# Patient Record
Sex: Male | Born: 1980 | Race: White | Hispanic: No | Marital: Married | Smoking: Never smoker
Health system: Southern US, Community
[De-identification: ages and names within clinical notes are randomized; demographics above are authoritative.]

## PROBLEM LIST (undated history)

## (undated) DIAGNOSIS — M109 Gout, unspecified: Secondary | ICD-10-CM

## (undated) HISTORY — PX: KNEE RECONSTRUCTION: SHX5883

---

## 2019-08-22 ENCOUNTER — Emergency Department
Admission: EM | Admit: 2019-08-22 | Discharge: 2019-08-22 | Disposition: A | Discharge status: Home / Self Care | Payer: Self-pay | Source: Home / Self Care

## 2019-08-22 ENCOUNTER — Other Ambulatory Visit: Payer: Self-pay

## 2019-08-22 DIAGNOSIS — M109 Gout, unspecified: Secondary | ICD-10-CM

## 2019-08-22 MED ORDER — COLCHICINE 0.6 MG PO TABS: ORAL_TABLET | ORAL | 0 refills | Status: AC

## 2019-08-22 MED ORDER — PREDNISONE 50 MG PO TABS: 50.0000 mg | ORAL_TABLET | Freq: Every day | ORAL | 0 refills | Status: AC

## 2019-08-22 NOTE — ED Triage Notes (Signed)
PT has a history of gout. Left foot pain and swelling started last night.

## 2019-08-22 NOTE — ED Provider Notes (Signed)
[facility]    CSN: [id number] Arrival date & time: 08/22/19  1104      History   Chief Complaint Chief Complaint  Patient presents with  . Foot Pain    HPI [facility] is a 38 y.o. male.   HPI [facility] is a 38 y.o. male presenting to UC with c/o sudden onset Left great toe pain and swelling, radiating up toward his ankle.  Pain started late last night and worsened this morning. Similar to prior gout flares. Last episode was 3-4 months ago. He has done well with colchicine.  Pain is currently 7/10.  He knows today's flare was triggered by consuming alcohol last night during a halloween party.    History reviewed. No pertinent past medical history.  There are no active problems to display for this patient.   Past Surgical History:  Procedure Laterality Date  . KNEE RECONSTRUCTION     both       Home Medications    Prior to Admission medications   Medication Sig Start Date End Date Taking? Authorizing Provider  colchicine 0.6 MG tablet Take 2 tabs (1.2mg ) followed one hour later by 1 tab (0.6mg ) 08/22/19   Lurene Shadow, PA-C  predniSONE (DELTASONE) 50 MG tablet Take 1 tablet (50 mg total) by mouth daily with breakfast for 5 days. 08/22/19 08/27/19  Lurene Shadow, PA-C    Family History History reviewed. No pertinent family history.  Social History Social History   Tobacco Use  . Smoking status: Never Smoker  . Smokeless tobacco: Current User  Substance Use Topics  . Alcohol use: Yes    Comment: 6/ week  . Drug use: Not Currently     Allergies   Patient has no known allergies.   Review of Systems Review of Systems  Constitutional: Negative for chills and fever.  Musculoskeletal: Positive for arthralgias and joint swelling.  Skin: Positive for color change. Negative for wound.     Physical Exam Triage Vital Signs ED Triage Vitals  Enc Vitals Group     BP 08/22/19 1123 (!) 158/90     Pulse Rate 08/22/19 1123 (!) 101    Resp 08/22/19 1123 20     Temp 08/22/19 1123 98.5 F (36.9 C)     Temp Source 08/22/19 1123 Oral     SpO2 08/22/19 1123 99 %     Weight 08/22/19 1120 295 lb (133.8 kg)     Height 08/22/19 1120 5\' 10"  (1.778 m)     Head Circumference --      Peak Flow --      Pain Score 08/22/19 1119 7     Pain Loc --      Pain Edu? --      Excl. in GC? --    No data found.  Updated Vital Signs BP (!) 158/90 (BP Location: Right Arm)   Pulse (!) 101   Temp 98.5 F (36.9 C) (Oral)   Resp 20   Ht 5\' 10"  (1.778 m)   Wt 295 lb (133.8 kg)   SpO2 99%   BMI 42.33 kg/m   Visual Acuity Right Eye Distance:   Left Eye Distance:   Bilateral Distance:    Right Eye Near:   Left Eye Near:    Bilateral Near:     Physical Exam Vitals signs and nursing note reviewed.  Constitutional:      Appearance: He is well-developed.  HENT:     Head: Normocephalic and atraumatic.  Neck:     Musculoskeletal: Normal range of motion.  Cardiovascular:     Rate and Rhythm: Normal rate.  Pulmonary:     Effort: Pulmonary effort is normal.  Musculoskeletal: Normal range of motion.        General: Swelling and tenderness present.     Comments: Left foot: mild to moderate edema at 1st metatarsal joint. Mild edema of foot. Tenderness to light touch over 1st metatarsal joint and dorsum of foot.  Full ROM all toes and ankle.   Skin:    General: Skin is warm and dry.     Capillary Refill: Capillary refill takes less than 2 seconds.     Findings: Erythema present.     Comments: Left foot: erythema of great toe.  Neurological:     Mental Status: He is alert and oriented to person, place, and time.     Sensory: No sensory deficit.  Psychiatric:        Behavior: Behavior normal.      UC Treatments / Results  Labs (all labs ordered are listed, but only abnormal results are displayed) Labs Reviewed - No data to display  EKG   Radiology No results found.  Procedures Procedures (including critical care  time)  Medications Ordered in UC Medications - No data to display  Initial Impression / Assessment and Plan / UC Course  I have reviewed the triage vital signs and the nursing notes.  Pertinent labs & imaging results that were available during my care of the patient were reviewed by me and considered in my medical decision making (see chart for details).    Hx and exam c/w gout F/u with PCP or Sports Medicine  AVS provided  Final Clinical Impressions(s) / UC Diagnoses   Final diagnoses:  Gouty arthritis of left great toe     Discharge Instructions      Please take your medication as prescribed. Follow up with family medicine or sports medicine as needed.  The prednisone you have been prescribed for 5 days can cause your blood pressure to be elevated. Please be sure to monitor your blood pressure. If you blood pressure continues to elevate, you may stop the prednisone at any time.     ED Prescriptions    Medication Sig Dispense Auth. Provider   colchicine 0.6 MG tablet Take 2 tabs (1.2mg ) followed one hour later by 1 tab (0.6mg ) 3 tablet Gerarda Fraction, Adaijah Endres O, PA-C   predniSONE (DELTASONE) 50 MG tablet Take 1 tablet (50 mg total) by mouth daily with breakfast for 5 days. 5 tablet Noe Gens, [place]     I have reviewed the PDMP during this encounter.   Noe Gens, PA-C 08/22/19 1143

## 2019-08-22 NOTE — Discharge Instructions (Signed)
°  Please take your medication as prescribed. Follow up with family medicine or sports medicine as needed.  The prednisone you have been prescribed for 5 days can cause your blood pressure to be elevated. Please be sure to monitor your blood pressure. If you blood pressure continues to elevate, you may stop the prednisone at any time.

## 2020-10-25 ENCOUNTER — Emergency Department: Admit: 2020-10-25 | Discharge status: Absent without leave | Payer: Self-pay

## 2020-10-25 ENCOUNTER — Emergency Department (INDEPENDENT_AMBULATORY_CARE_PROVIDER_SITE_OTHER)
Admission: EM | Admit: 2020-10-25 | Discharge: 2020-10-25 | Disposition: A | Discharge status: Home / Self Care | Payer: Self-pay | Source: Home / Self Care | Attending: Family Medicine | Admitting: Family Medicine

## 2020-10-25 ENCOUNTER — Other Ambulatory Visit: Payer: Self-pay

## 2020-10-25 DIAGNOSIS — R509 Fever, unspecified: Secondary | ICD-10-CM

## 2020-10-25 DIAGNOSIS — R1032 Left lower quadrant pain: Secondary | ICD-10-CM

## 2020-10-25 HISTORY — DX: Gout, unspecified: M10.9

## 2020-10-25 LAB — POCT URINALYSIS DIP (MANUAL ENTRY)
Glucose, UA: NEGATIVE mg/dL
Leukocytes, UA: NEGATIVE
Nitrite, UA: NEGATIVE
Protein Ur, POC: 100 mg/dL — AB
Spec Grav, UA: 1.02 (ref 1.010–1.025)
Urobilinogen, UA: 4 E.U./dL — AB
pH, UA: 6 (ref 5.0–8.0)

## 2020-10-25 LAB — POCT CBC W AUTO DIFF (K'VILLE URGENT CARE)

## 2020-10-25 NOTE — ED Notes (Signed)
Spoke w/ [facility] to request STAT pickup. Confirmation number [id number] given.

## 2020-10-25 NOTE — ED Triage Notes (Signed)
Pt presents to Urgent Care with c/o LLQ pain since yesterday. Reports noticing dark "orange" urine this AM.

## 2020-10-25 NOTE — ED Provider Notes (Signed)
[name]    CSN: [id number] Arrival date & time: 10/25/20  1926      History   Chief Complaint Chief Complaint  Patient presents with  . Abdominal Pain    LLQ    HPI Coleston Dirosa is a 40 y.o. male.   He is presenting with left lower quadrant abdominal pain.  He also has an elevated temperature.  His symptoms started yesterday and have progressed through today.  Denies any changes in his bowel movements.  He has had orange urine.  Denies any back pain.  No history of abdominal surgeries.  Denies any emesis.  No exposure anyone to flu or Covid.  HPI  Past Medical History:  Diagnosis Date  . Gout     There are no problems to display for this patient.   Past Surgical History:  Procedure Laterality Date  . KNEE RECONSTRUCTION     both       Home Medications    Prior to Admission medications   Medication Sig Start Date End Date Taking? Authorizing Provider  ibuprofen (ADVIL) 200 MG tablet Take 200 mg by mouth every 6 (six) hours as needed.   Yes [provider]  colchicine 0.6 MG tablet Take 2 tabs (1.2mg ) followed one hour later by 1 tab (0.6mg ) Patient taking differently: Take by mouth daily as needed. Take 2 tabs (1.2mg ) followed one hour later by 1 tab (0.6mg ) 08/22/19   Lurene Shadow, PA-C    Family History Family History  Problem Relation Age of Onset  . Heart Problems Mother     Social History Social History   Tobacco Use  . Smoking status: Never Smoker  . Smokeless tobacco: Current User    Types: Snuff  Vaping Use  . Vaping Use: Never used  Substance Use Topics  . Alcohol use: Yes    Alcohol/week: 6.0 standard drinks    Types: 6 Cans of beer per week    Comment: daily, 4-5 days per week  . Drug use: Not Currently     Allergies   Patient has no known allergies.   Review of Systems Review of Systems  See HPI   Physical Exam Triage Vital Signs ED Triage Vitals  Enc Vitals Group     BP 10/25/20 1959 138/85      Pulse Rate 10/25/20 1959 (!) 115     Resp 10/25/20 1959 18     Temp 10/25/20 1959 (!) 100.7 F (38.2 C)     Temp Source 10/25/20 1959 Oral     SpO2 10/25/20 1959 96 %     Weight 10/25/20 1958 295 lb (133.8 kg)     Height 10/25/20 1958 5\' 10"  (1.778 m)     Head Circumference --      Peak Flow --      Pain Score 10/25/20 1958 4     Pain Loc --      Pain Edu? --      Excl. in GC? --    No data found.  Updated Vital Signs BP 138/85 (BP Location: Right Arm)   Pulse (!) 115   Temp (!) 100.7 F (38.2 C) (Oral)   Resp 18   Ht 5\' 10"  (1.778 m)   Wt 133.8 kg   SpO2 96%   BMI 42.33 kg/m   Visual Acuity Right Eye Distance:   Left Eye Distance:   Bilateral Distance:    Right Eye Near:   Left Eye Near:    Bilateral  Near:     Physical Exam Gen: NAD, alert, cooperative with exam, well-appearing ENT: normal lips, normal nasal mucosa, tympanic membranes clear and intact bilaterally, normal oropharynx, no cervical lymphadenopathy Eye: normal EOM, normal conjunctiva and lids CV: Tachycardic S1-S2   Resp: no accessory muscle use, non-labored, clear to auscultation bilaterally, no crackles or wheezes GI: no masses or tenderness, no hernia, soft, normal bowel sounds Skin: no rashes, no areas of induration  Neuro: normal tone, normal sensation to touch Psych:  normal insight, alert and oriented MSK: Normal gait, normal strength    UC Treatments / Results  Labs (all labs ordered are listed, but only abnormal results are displayed) Labs Reviewed  POCT URINALYSIS DIP (MANUAL ENTRY) - Abnormal; Notable for the following components:      Result Value   Color, UA orange (*)    Bilirubin, UA moderate (*)    Ketones, POC UA moderate (40) (*)    Blood, UA trace-lysed (*)    Protein Ur, POC =100 (*)    Urobilinogen, UA 4.0 (*)    All other components within normal limits  COVID-19, FLU A+B NAA  COMPLETE METABOLIC PANEL WITH GFR  SEDIMENTATION RATE  C-REACTIVE PROTEIN  POCT CBC W  AUTO DIFF ([facility])    EKG   Radiology No results found.  Procedures Procedures (including critical care time)  Medications Ordered in UC Medications - No data to display  Initial Impression / Assessment and Plan / UC Course  I have reviewed the triage vital signs and the nursing notes.  Pertinent labs & imaging results that were available during my care of the patient were reviewed by me and considered in my medical decision making (see chart for details).     Mr. Espana is a 40 year old male that is presenting with left lower quadrant abdominal pain as well as a fever and tachycardic.  Urine was showing likely dehydration and CBC had a mild leukocytosis.  Unclear if this is more of a pyelonephritis versus an abdominal infection or Covid or flu.  CMP and sed rate and CRP were obtained.  Covid and flu swab were obtained.  Counseled on need for seeking immediate care.  Counseled on supportive care.  Given indications on follow-up.  Final Clinical Impressions(s) / UC Diagnoses   Final diagnoses:  Fever, unspecified fever cause  Left lower quadrant abdominal pain     Discharge Instructions     We will call with results.  Please seek immediate care if your symptoms worsen  Please take tylenol for fever  Please try to stay well hydrated.     ED Prescriptions    None     PDMP not reviewed this encounter.   Myra Rude, MD 10/25/20 2111

## 2020-10-25 NOTE — Discharge Instructions (Signed)
We will call with results.  Please seek immediate care if your symptoms worsen  Please take tylenol for fever  Please try to stay well hydrated.

## 2020-10-26 ENCOUNTER — Emergency Department (INDEPENDENT_AMBULATORY_CARE_PROVIDER_SITE_OTHER): Payer: Self-pay

## 2020-10-26 ENCOUNTER — Emergency Department (INDEPENDENT_AMBULATORY_CARE_PROVIDER_SITE_OTHER)
Admission: EM | Admit: 2020-10-26 | Discharge: 2020-10-26 | Disposition: A | Discharge status: Home / Self Care | Payer: Self-pay | Source: Home / Self Care

## 2020-10-26 DIAGNOSIS — K5720 Diverticulitis of large intestine with perforation and abscess without bleeding: Secondary | ICD-10-CM

## 2020-10-26 MED ORDER — IOHEXOL 300 MG/ML  SOLN
100.0000 mL | Freq: Once | INTRAMUSCULAR | Status: AC | PRN
  Administered 2020-10-26: 100 mL via INTRAVENOUS

## 2020-10-26 NOTE — Discharge Instructions (Signed)
°  You have declined EMS transport.  Please have your wife drive you directly to the emergency department for further evaluation and treatment of your perforated (hole) colon due to diverticulitis. You will likely need to be admitted and stay overnight in the hospital for monitoring and treatment, possible surgery.  Do not eat or drink anything until instructed to do so by medical staff at the hospital.

## 2020-10-26 NOTE — ED Notes (Addendum)
Patient is being discharged from the Urgent Care and sent to the Emergency Department via POV. Per Waylan Rocher, patient is in need of higher level of care due to Sigmoid diverticulitis with evidence of associated perforation. Patient is aware and verbalizes understanding of plan of care.  Vitals:   10/26/20 1445  BP: (!) 155/100  Pulse: (!) 110  Resp: 20  Temp: 99 F (37.2 C)  SpO2: 98%

## 2020-10-26 NOTE — ED Provider Notes (Signed)
[facility]    CSN: [id number] Arrival date & time: 10/26/20  1436      History   Chief Complaint Chief Complaint  Patient presents with  . Abdominal Pain    Follow up/ CT     HPI [facility] is a 40 y.o. male.   HPI [facility] is a 40 y.o. male presenting to UC with c/o continued LLQ abdominal pain and discomfort, loss of appetite. He was seen late last night for LLQ abdominal pain, low grade fever. Slightly elevated WBC. CMP came back today with elevated LFTs.  Pt continued to have discomfort, was instructed to come in for CT scan. No medication taken PTA. Had a small amount of grits yesterday but has not been able to eat for 2-3 days due to discomfort. No n/v/d. No hx of diverticulitis. Still has gallbladder and appendix.     Past Medical History:  Diagnosis Date  . Gout     There are no problems to display for this patient.   Past Surgical History:  Procedure Laterality Date  . KNEE RECONSTRUCTION     both       Home Medications    Prior to Admission medications   Medication Sig Start Date End Date Taking? Authorizing Provider  colchicine 0.6 MG tablet Take 2 tabs (1.2mg ) followed one hour later by 1 tab (0.6mg ) Patient taking differently: Take by mouth daily as needed. Take 2 tabs (1.2mg ) followed one hour later by 1 tab (0.6mg ) 08/22/19   Lurene Shadow, PA-C  ibuprofen (ADVIL) 200 MG tablet Take 200 mg by mouth every 6 (six) hours as needed.    [provider]    Family History Family History  Problem Relation Age of Onset  . Heart Problems Mother     Social History Social History   Tobacco Use  . Smoking status: Never Smoker  . Smokeless tobacco: Current User    Types: Snuff  Vaping Use  . Vaping Use: Never used  Substance Use Topics  . Alcohol use: Yes    Alcohol/week: 6.0 standard drinks    Types: 6 Cans of beer per week    Comment: daily, 4-5 days per week  . Drug use: Not Currently     Allergies    Patient has no known allergies.   Review of Systems Review of Systems  Constitutional: Positive for appetite change and fever. Negative for chills.  Respiratory: Negative for chest tightness and shortness of breath.   Gastrointestinal: Positive for abdominal pain. Negative for diarrhea, nausea and vomiting.  Musculoskeletal: Negative for back pain.     Physical Exam Triage Vital Signs ED Triage Vitals  Enc Vitals Group     BP      Pulse      Resp      Temp      Temp src      SpO2      Weight      Height      Head Circumference      Peak Flow      Pain Score      Pain Loc      Pain Edu?      Excl. in GC?    No data found.  Updated Vital Signs BP (!) 155/100 (BP Location: Right Arm)   Pulse (!) 110   Temp 99 F (37.2 C) (Oral)   Resp 20   SpO2 98%   Visual Acuity Right Eye Distance:   Left  Eye Distance:   Bilateral Distance:    Right Eye Near:   Left Eye Near:    Bilateral Near:     Physical Exam Vitals and nursing note reviewed.  Constitutional:      General: He is not in acute distress.    Appearance: He is well-developed and well-nourished. He is not ill-appearing, toxic-appearing or diaphoretic.  HENT:     Head: Normocephalic and atraumatic.  Eyes:     Extraocular Movements: EOM normal.  Cardiovascular:     Rate and Rhythm: Regular rhythm. Tachycardia present.     Comments: Mild tachycardia Pulmonary:     Effort: Pulmonary effort is normal. No respiratory distress.     Breath sounds: Normal breath sounds.  Abdominal:     General: There is no distension.     Palpations: Abdomen is soft.     Tenderness: There is generalized abdominal tenderness. There is no right CVA tenderness, left CVA tenderness, guarding or rebound. Negative signs include Murphy's sign and McBurney's sign.  Musculoskeletal:        General: Normal range of motion.     Cervical back: Normal range of motion.  Skin:    General: Skin is warm and dry.  Neurological:      Mental Status: He is alert and oriented to person, place, and time.  Psychiatric:        Mood and Affect: Mood and affect normal.        Behavior: Behavior normal.      UC Treatments / Results  Labs (all labs ordered are listed, but only abnormal results are displayed) Labs Reviewed - No data to display  EKG   Radiology CT ABDOMEN PELVIS W CONTRAST  Result Date: 10/26/2020 CLINICAL DATA:  Abdominal discomfort. EXAM: CT ABDOMEN AND PELVIS WITH CONTRAST TECHNIQUE: Multidetector CT imaging of the abdomen and pelvis was performed using the standard protocol following bolus administration of intravenous contrast. CONTRAST:  OMNIPAQUE IOHEXOL 300 MG/ML  SOLN COMPARISON:  None. FINDINGS: Lower chest: No acute abnormality. Hepatobiliary: There is mild diffuse fatty infiltration of the liver parenchyma. No focal liver abnormality is seen. No gallstones, gallbladder wall thickening, or biliary dilatation. Pancreas: Unremarkable. No pancreatic ductal dilatation or surrounding inflammatory changes. Spleen: Normal in size without focal abnormality. Adrenals/Urinary Tract: Adrenal glands are unremarkable. Kidneys are normal, without renal calculi, focal lesion, or hydronephrosis. Bladder is unremarkable. Stomach/Bowel: Stomach is within normal limits. Appendix appears normal. No evidence of bowel dilatation. A markedly inflamed diverticulum is seen within the proximal aspect of the sigmoid colon. In addition to associated pericolonic inflammatory fat stranding, numerous, predominantly subcentimeter foci of contained free air are seen. Vascular/Lymphatic: No significant vascular findings are present. No enlarged abdominal or pelvic lymph nodes. Reproductive: Prostate is unremarkable. Other: There is a 5.4 cm x 4.3 cm left-sided fat containing para umbilical hernia. No abdominopelvic free fluid is noted. Musculoskeletal: No acute or significant osseous findings. IMPRESSION: 1. Sigmoid diverticulitis with  evidence of associated perforation and contained free air. 2. Mild diffuse fatty infiltration of the liver parenchyma. 3. Fat-containing para umbilical hernia. Electronically Signed   By: Aram Candela M.D.   On: 10/26/2020 15:58    Procedures Procedures (including critical care time)  Medications Ordered in UC Medications - No data to display  Initial Impression / Assessment and Plan / UC Course  I have reviewed the triage vital signs and the nursing notes.  Pertinent labs & imaging results that were available during my care of the patient  were reviewed by me and considered in my medical decision making (see chart for details).     Discussed imaging with pt  Recommend further evaluation and treatment in emergency department Pt verbalized understanding and agreement with tx plan Declined EMS transport, states his wife drove him to UC and will be able to drive him to the hospital. Advised to drive straight there, no to eat or drink along the way.   Final Clinical Impressions(s) / UC Diagnoses   Final diagnoses:  Diverticulitis of colon with perforation     Discharge Instructions      You have declined EMS transport.  Please have your wife drive you directly to the emergency department for further evaluation and treatment of your perforated (hole) colon due to diverticulitis. You will likely need to be admitted and stay overnight in the hospital for monitoring and treatment, possible surgery.  Do not eat or drink anything until instructed to do so by medical staff at the hospital.     ED Prescriptions    None     PDMP not reviewed this encounter.   Noe Gens, [place] 10/26/20 [phone]

## 2020-10-26 NOTE — ED Triage Notes (Signed)
Patient presents to Urgent Care with complaints of continued abdominal discomfort since last night. Patient reports he was seen here last evening and had elevated ALT and AST levels, brought back today by provider to have CT scan for eval.

## 2020-10-27 LAB — COMPLETE METABOLIC PANEL WITH GFR
AG Ratio: 1.6 (calc) (ref 1.0–2.5)
ALT: 107 U/L — ABNORMAL HIGH (ref 9–46)
AST: 142 U/L — ABNORMAL HIGH (ref 10–40)
Albumin: 4.5 g/dL (ref 3.6–5.1)
Alkaline phosphatase (APISO): 172 U/L — ABNORMAL HIGH (ref 36–130)
BUN: 12 mg/dL (ref 7–25)
CO2: 25 mmol/L (ref 20–32)
Calcium: 9.4 mg/dL (ref 8.6–10.3)
Chloride: 99 mmol/L (ref 98–110)
Creat: 1.22 mg/dL (ref 0.60–1.35)
GFR, Est African American: 86 mL/min/{1.73_m2} (ref >=60)
GFR, Est Non African American: 74 mL/min/{1.73_m2} (ref >=60)
Globulin: 2.8 g/dL (calc) (ref 1.9–3.7)
Glucose, Bld: 99 mg/dL (ref 65–99)
Potassium: 3.8 mmol/L (ref 3.5–5.3)
Sodium: 136 mmol/L (ref 135–146)
Total Bilirubin: 2.2 mg/dL — ABNORMAL HIGH (ref 0.2–1.2)
Total Protein: 7.3 g/dL (ref 6.1–8.1)

## 2020-10-27 LAB — C-REACTIVE PROTEIN: CRP: 294.6 mg/L — ABNORMAL HIGH (ref <8.0)

## 2020-10-27 LAB — SEDIMENTATION RATE: Sed Rate: 62 mm/h — ABNORMAL HIGH (ref 0–15)

## 2020-10-30 LAB — COVID-19, FLU A+B NAA
Influenza A, NAA: NOT DETECTED
Influenza B, NAA: NOT DETECTED
SARS-CoV-2, NAA: NOT DETECTED

## 2021-05-23 IMAGING — CT CT ABD-PELV W/ CM
2 of 4 series · 16 of 46 positions shown, 18 images · IV contrast (omnipaque)
Comparison: None.

CLINICAL DATA: Abdominal discomfort.

EXAM:
CT ABDOMEN AND PELVIS WITH CONTRAST
TECHNIQUE: Multidetector CT imaging of the abdomen and pelvis was performed
using the standard protocol following bolus administration of
intravenous contrast.
CONTRAST:  100mL OMNIPAQUE IOHEXOL 300 MG/ML  SOLN

[Series 3: axial st · axial · 0.98mm/px · z∈[-718,-138]mm · 13 of 128 slices shown, 15 images]
[im 6/128  soft-tissue]
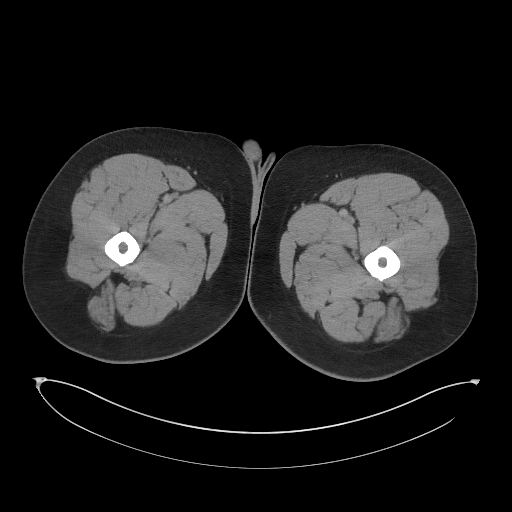
[im 6/128  bone]
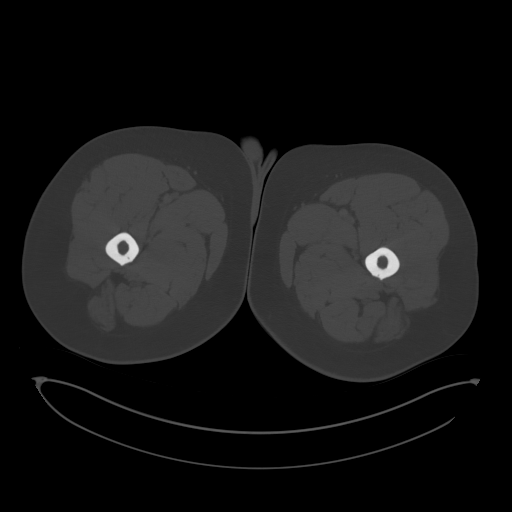
[im 16/128  soft-tissue]
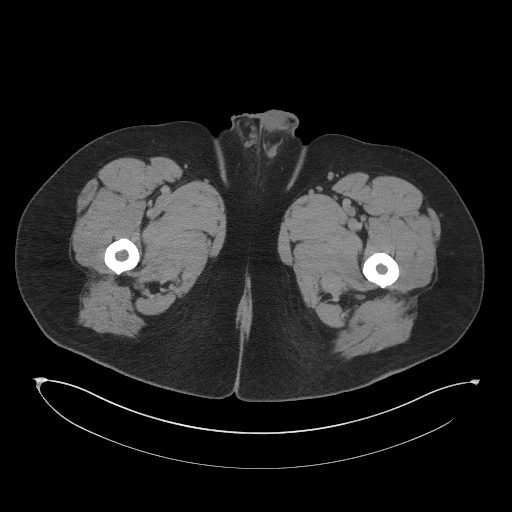
[im 27/128  soft-tissue]
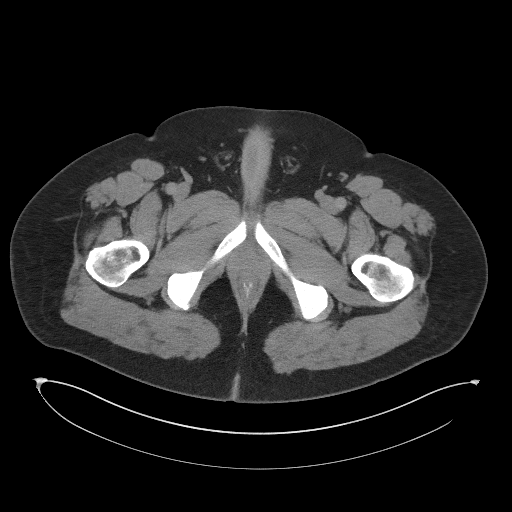
[im 38/128  soft-tissue]
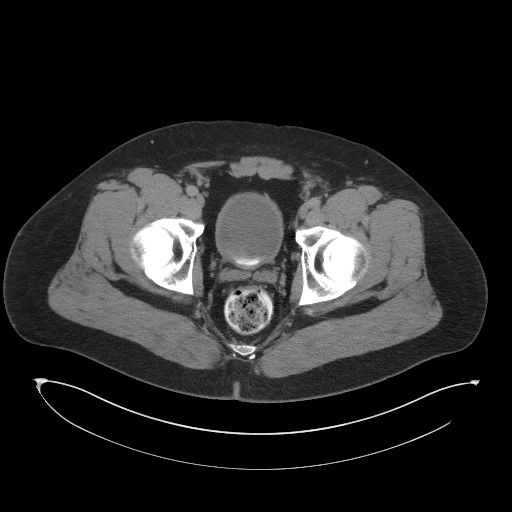
[im 43/128  soft-tissue]
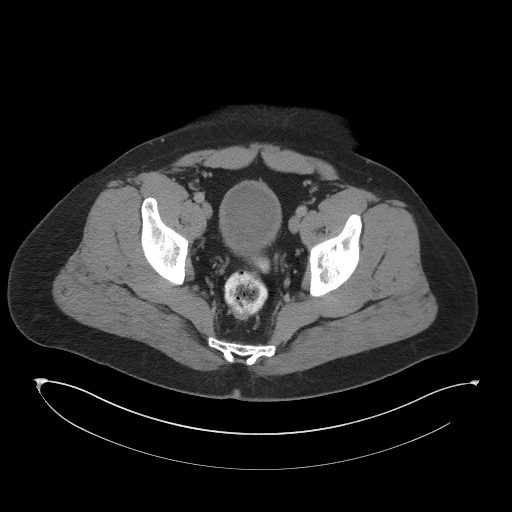
[im 53/128  soft-tissue]
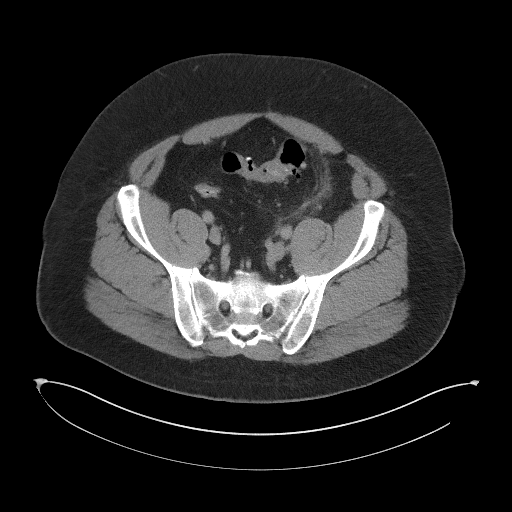
[im 64/128  soft-tissue]
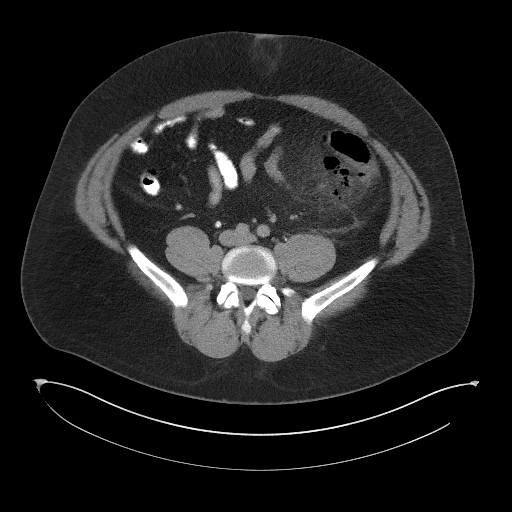
[im 75/128  soft-tissue]
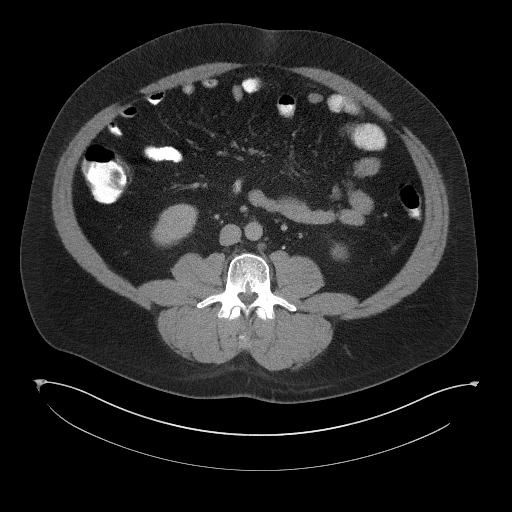
[im 85/128  soft-tissue]
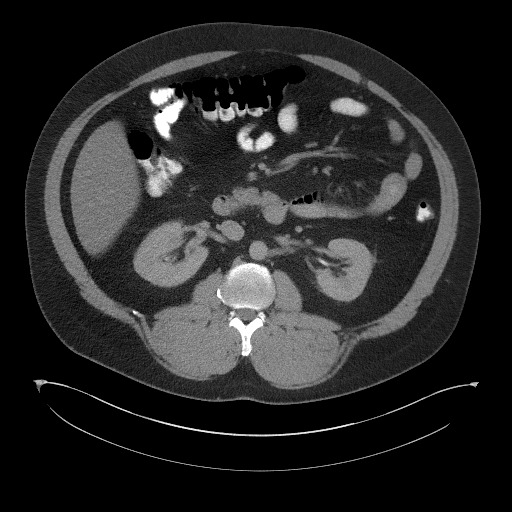
[im 85/128  bone]
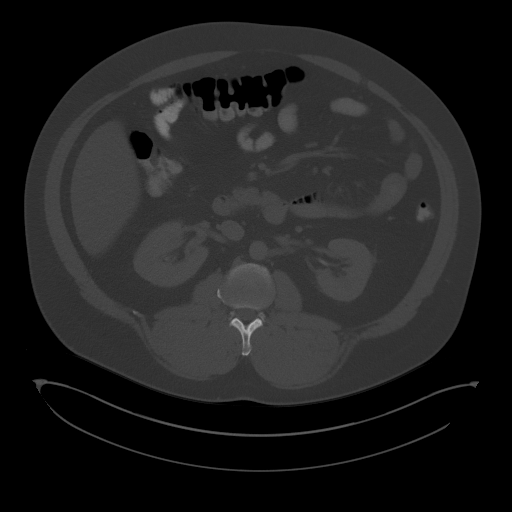
[im 90/128  soft-tissue]
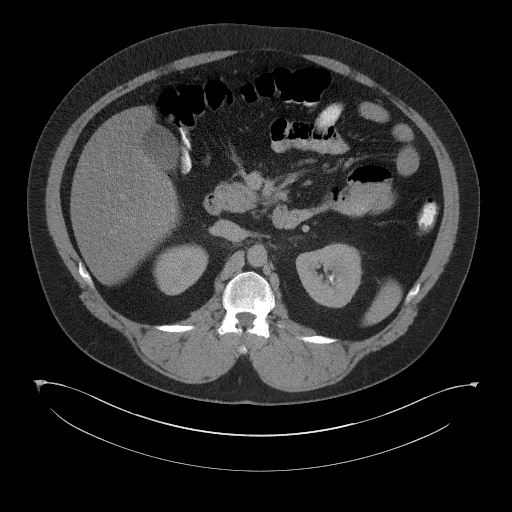
[im 101/128  soft-tissue]
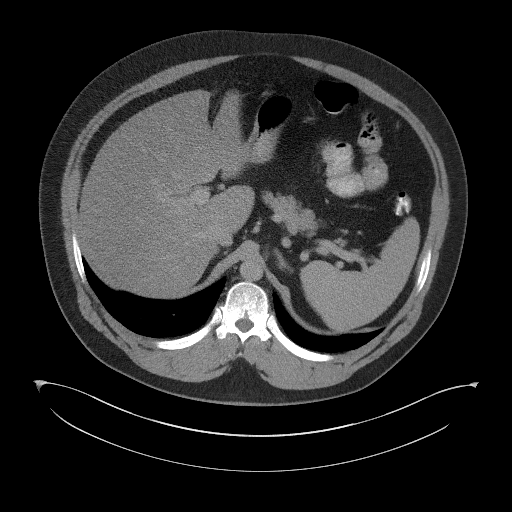
[im 112/128  soft-tissue]
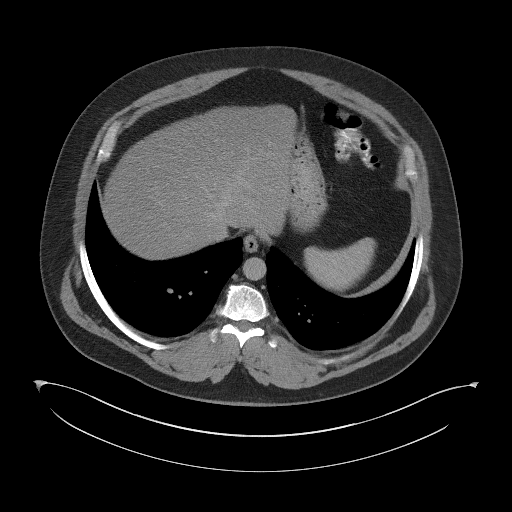
[im 122/128  soft-tissue]
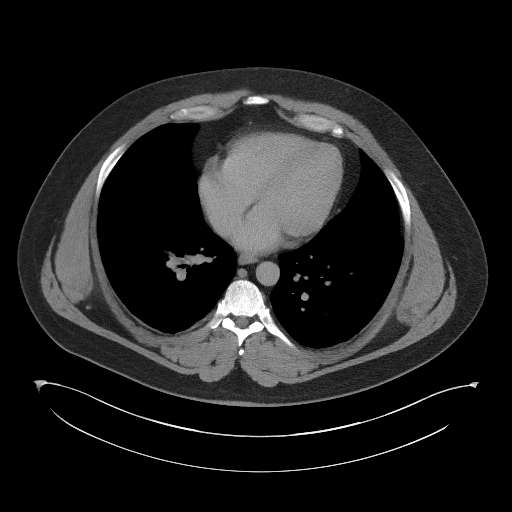

[Series 6: coronal st · coronal · 0.94mm/px · 3 of 142 slices shown]
[im 48/142  soft-tissue]
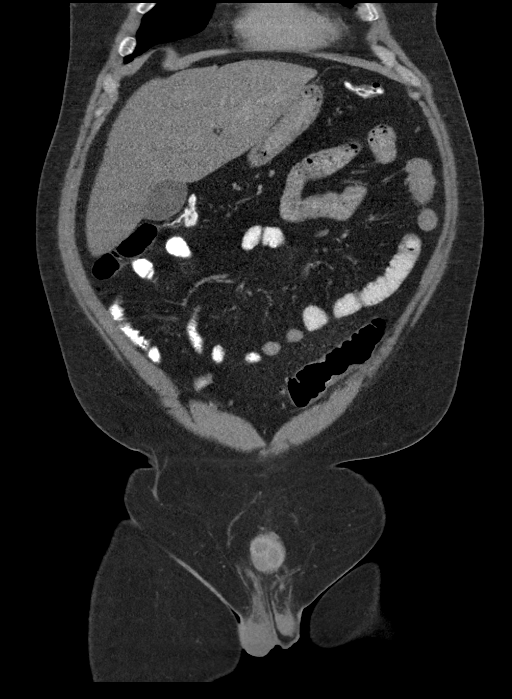
[im 63/142  soft-tissue]
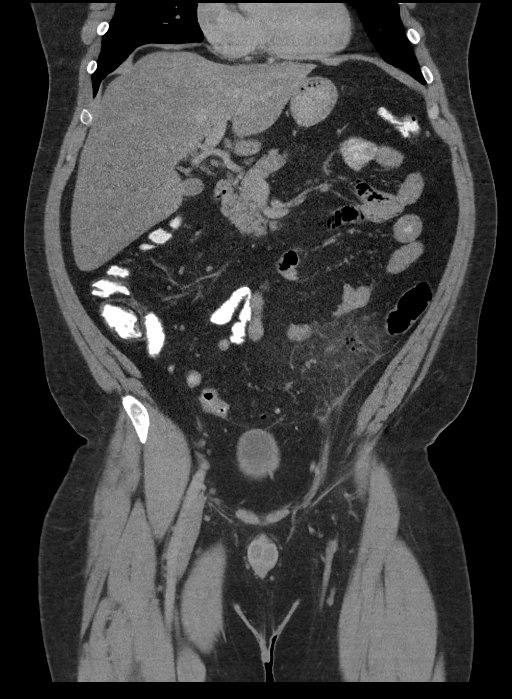
[im 79/142  soft-tissue]
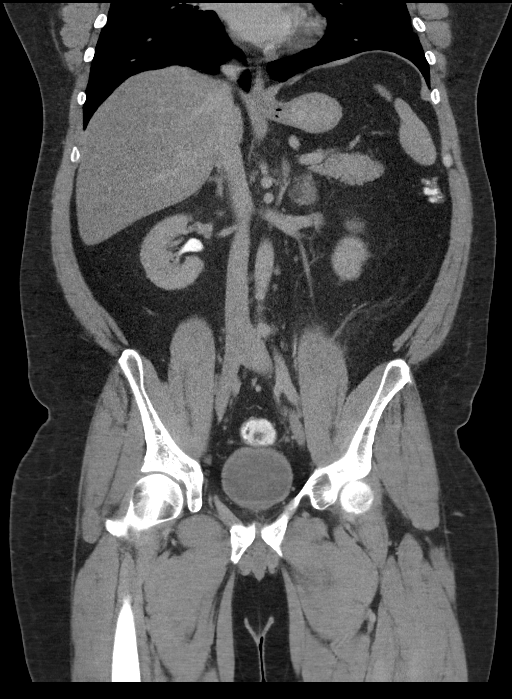

[16 of 46 positions shown; findings below may reference images not displayed]

FINDINGS: Lower chest: No acute abnormality.

Hepatobiliary: There is mild diffuse fatty infiltration of the liver
parenchyma. No focal liver abnormality is seen. No gallstones,
gallbladder wall thickening, or biliary dilatation.

Pancreas: Unremarkable. No pancreatic ductal dilatation or
surrounding inflammatory changes.

Spleen: Normal in size without focal abnormality.

Adrenals/Urinary Tract: Adrenal glands are unremarkable. Kidneys are
normal, without renal calculi, focal lesion, or hydronephrosis.
Bladder is unremarkable.

Stomach/Bowel: Stomach is within normal limits. Appendix appears
normal. No evidence of bowel dilatation. A markedly inflamed
diverticulum is seen within the proximal aspect of the sigmoid
colon. In addition to associated pericolonic inflammatory fat
stranding, numerous, predominantly subcentimeter foci of contained
free air are seen.

Vascular/Lymphatic: No significant vascular findings are present. No
enlarged abdominal or pelvic lymph nodes.

Reproductive: Prostate is unremarkable.

Other: There is a 5.4 cm x 4.3 cm left-sided fat containing para
umbilical hernia.

No abdominopelvic free fluid is noted.

Musculoskeletal: No acute or significant osseous findings.
IMPRESSION: 1. Sigmoid diverticulitis with evidence of associated perforation
and contained free air.
2. Mild diffuse fatty infiltration of the liver parenchyma.
3. Fat-containing para umbilical hernia.
